# Patient Record
Sex: Female | Born: 1960 | Race: Black or African American | Hispanic: No | Marital: Married
Health system: Southern US, Community
[De-identification: ages and names within clinical notes are randomized; demographics above are authoritative.]

---

## 2002-12-03 DIAGNOSIS — I10 Essential (primary) hypertension: Secondary | ICD-10-CM | POA: Insufficient documentation

## 2004-07-14 ENCOUNTER — Encounter: Admission: RE | Admit: 2004-07-14 | Discharge: 2004-07-14 | Payer: Self-pay | Admitting: Family Medicine

## 2004-07-20 ENCOUNTER — Encounter: Admission: RE | Admit: 2004-07-20 | Discharge: 2004-07-20 | Payer: Self-pay | Admitting: Family Medicine

## 2004-11-06 ENCOUNTER — Ambulatory Visit (HOSPITAL_BASED_OUTPATIENT_CLINIC_OR_DEPARTMENT_OTHER): Admission: RE | Admit: 2004-11-06 | Discharge: 2004-11-06 | Payer: Self-pay | Admitting: Specialist

## 2004-11-06 ENCOUNTER — Ambulatory Visit (HOSPITAL_COMMUNITY): Admission: RE | Admit: 2004-11-06 | Discharge: 2004-11-06 | Payer: Self-pay | Admitting: Specialist

## 2013-09-21 ENCOUNTER — Other Ambulatory Visit: Payer: Self-pay

## 2013-09-21 DIAGNOSIS — Z9889 Other specified postprocedural states: Secondary | ICD-10-CM

## 2013-09-21 DIAGNOSIS — Z1231 Encounter for screening mammogram for malignant neoplasm of breast: Secondary | ICD-10-CM

## 2013-10-06 ENCOUNTER — Ambulatory Visit
Admission: RE | Admit: 2013-10-06 | Discharge: 2013-10-06 | Disposition: A | Discharge status: Home / Self Care | Payer: Self-pay | Source: Ambulatory Visit

## 2013-10-06 DIAGNOSIS — Z1231 Encounter for screening mammogram for malignant neoplasm of breast: Secondary | ICD-10-CM

## 2013-10-06 DIAGNOSIS — Z9889 Other specified postprocedural states: Secondary | ICD-10-CM

## 2013-10-09 ENCOUNTER — Other Ambulatory Visit: Payer: Self-pay | Admitting: Obstetrics and Gynecology

## 2013-10-09 DIAGNOSIS — R928 Other abnormal and inconclusive findings on diagnostic imaging of breast: Secondary | ICD-10-CM

## 2013-10-16 ENCOUNTER — Ambulatory Visit
Admission: RE | Admit: 2013-10-16 | Discharge: 2013-10-16 | Disposition: A | Discharge status: Home / Self Care | Payer: BC Managed Care – PPO | Source: Ambulatory Visit | Attending: Obstetrics and Gynecology | Admitting: Obstetrics and Gynecology

## 2013-10-16 DIAGNOSIS — R928 Other abnormal and inconclusive findings on diagnostic imaging of breast: Secondary | ICD-10-CM

## 2013-10-28 ENCOUNTER — Other Ambulatory Visit: Payer: BC Managed Care – PPO

## 2016-09-03 DIAGNOSIS — M5430 Sciatica, unspecified side: Secondary | ICD-10-CM | POA: Diagnosis not present

## 2016-09-19 DIAGNOSIS — Z0000 Encounter for general adult medical examination without abnormal findings: Secondary | ICD-10-CM | POA: Diagnosis not present

## 2016-09-19 DIAGNOSIS — H9311 Tinnitus, right ear: Secondary | ICD-10-CM | POA: Diagnosis not present

## 2016-09-19 DIAGNOSIS — I10 Essential (primary) hypertension: Secondary | ICD-10-CM | POA: Diagnosis not present

## 2016-09-19 DIAGNOSIS — Z23 Encounter for immunization: Secondary | ICD-10-CM | POA: Diagnosis not present

## 2017-03-25 DIAGNOSIS — H40053 Ocular hypertension, bilateral: Secondary | ICD-10-CM | POA: Diagnosis not present

## 2017-03-25 DIAGNOSIS — H40023 Open angle with borderline findings, high risk, bilateral: Secondary | ICD-10-CM | POA: Diagnosis not present

## 2017-11-01 DIAGNOSIS — H9319 Tinnitus, unspecified ear: Secondary | ICD-10-CM | POA: Diagnosis not present

## 2017-11-01 DIAGNOSIS — E7800 Pure hypercholesterolemia, unspecified: Secondary | ICD-10-CM | POA: Diagnosis not present

## 2017-11-01 DIAGNOSIS — I10 Essential (primary) hypertension: Secondary | ICD-10-CM | POA: Diagnosis not present

## 2017-11-01 DIAGNOSIS — K219 Gastro-esophageal reflux disease without esophagitis: Secondary | ICD-10-CM | POA: Diagnosis not present

## 2017-11-01 DIAGNOSIS — Z0000 Encounter for general adult medical examination without abnormal findings: Secondary | ICD-10-CM | POA: Diagnosis not present

## 2017-11-06 DIAGNOSIS — Z130 Encounter for screening for diseases of the blood and blood-forming organs and certain disorders involving the immune mechanism: Secondary | ICD-10-CM | POA: Diagnosis not present

## 2017-11-06 DIAGNOSIS — Z1231 Encounter for screening mammogram for malignant neoplasm of breast: Secondary | ICD-10-CM | POA: Diagnosis not present

## 2017-11-06 DIAGNOSIS — Z124 Encounter for screening for malignant neoplasm of cervix: Secondary | ICD-10-CM | POA: Diagnosis not present

## 2017-11-06 DIAGNOSIS — Z1389 Encounter for screening for other disorder: Secondary | ICD-10-CM | POA: Diagnosis not present

## 2017-11-06 DIAGNOSIS — Z01419 Encounter for gynecological examination (general) (routine) without abnormal findings: Secondary | ICD-10-CM | POA: Diagnosis not present

## 2017-11-06 DIAGNOSIS — Z1151 Encounter for screening for human papillomavirus (HPV): Secondary | ICD-10-CM | POA: Diagnosis not present

## 2017-11-06 DIAGNOSIS — N951 Menopausal and female climacteric states: Secondary | ICD-10-CM | POA: Diagnosis not present

## 2017-11-06 DIAGNOSIS — Z6841 Body Mass Index (BMI) 40.0 and over, adult: Secondary | ICD-10-CM | POA: Diagnosis not present

## 2017-11-28 DIAGNOSIS — H9313 Tinnitus, bilateral: Secondary | ICD-10-CM | POA: Diagnosis not present

## 2017-11-28 DIAGNOSIS — H9311 Tinnitus, right ear: Secondary | ICD-10-CM | POA: Diagnosis not present

## 2017-11-28 DIAGNOSIS — H903 Sensorineural hearing loss, bilateral: Secondary | ICD-10-CM | POA: Diagnosis not present

## 2017-11-28 DIAGNOSIS — H6123 Impacted cerumen, bilateral: Secondary | ICD-10-CM | POA: Diagnosis not present

## 2018-01-06 DIAGNOSIS — Z01419 Encounter for gynecological examination (general) (routine) without abnormal findings: Secondary | ICD-10-CM | POA: Diagnosis not present

## 2018-05-14 DIAGNOSIS — K219 Gastro-esophageal reflux disease without esophagitis: Secondary | ICD-10-CM | POA: Diagnosis not present

## 2018-05-14 DIAGNOSIS — I10 Essential (primary) hypertension: Secondary | ICD-10-CM | POA: Diagnosis not present

## 2018-05-14 DIAGNOSIS — M7630 Iliotibial band syndrome, unspecified leg: Secondary | ICD-10-CM | POA: Diagnosis not present

## 2018-11-07 DIAGNOSIS — Z1231 Encounter for screening mammogram for malignant neoplasm of breast: Secondary | ICD-10-CM | POA: Diagnosis not present

## 2018-11-07 DIAGNOSIS — Z6841 Body Mass Index (BMI) 40.0 and over, adult: Secondary | ICD-10-CM | POA: Diagnosis not present

## 2018-11-07 DIAGNOSIS — Z01419 Encounter for gynecological examination (general) (routine) without abnormal findings: Secondary | ICD-10-CM | POA: Diagnosis not present

## 2018-11-07 DIAGNOSIS — Z130 Encounter for screening for diseases of the blood and blood-forming organs and certain disorders involving the immune mechanism: Secondary | ICD-10-CM | POA: Diagnosis not present

## 2018-11-07 DIAGNOSIS — Z1389 Encounter for screening for other disorder: Secondary | ICD-10-CM | POA: Diagnosis not present

## 2018-11-10 DIAGNOSIS — Z01419 Encounter for gynecological examination (general) (routine) without abnormal findings: Secondary | ICD-10-CM | POA: Diagnosis not present

## 2018-12-26 DIAGNOSIS — H6123 Impacted cerumen, bilateral: Secondary | ICD-10-CM | POA: Diagnosis not present

## 2018-12-26 DIAGNOSIS — K219 Gastro-esophageal reflux disease without esophagitis: Secondary | ICD-10-CM | POA: Diagnosis not present

## 2018-12-26 DIAGNOSIS — E7800 Pure hypercholesterolemia, unspecified: Secondary | ICD-10-CM | POA: Diagnosis not present

## 2018-12-26 DIAGNOSIS — I10 Essential (primary) hypertension: Secondary | ICD-10-CM | POA: Diagnosis not present

## 2019-10-19 DIAGNOSIS — Z03818 Encounter for observation for suspected exposure to other biological agents ruled out: Secondary | ICD-10-CM | POA: Diagnosis not present

## 2019-10-20 DIAGNOSIS — Z20828 Contact with and (suspected) exposure to other viral communicable diseases: Secondary | ICD-10-CM | POA: Diagnosis not present

## 2019-11-25 DIAGNOSIS — Z1231 Encounter for screening mammogram for malignant neoplasm of breast: Secondary | ICD-10-CM | POA: Diagnosis not present

## 2019-11-25 DIAGNOSIS — Z130 Encounter for screening for diseases of the blood and blood-forming organs and certain disorders involving the immune mechanism: Secondary | ICD-10-CM | POA: Diagnosis not present

## 2019-11-25 DIAGNOSIS — Z1389 Encounter for screening for other disorder: Secondary | ICD-10-CM | POA: Diagnosis not present

## 2019-11-25 DIAGNOSIS — Z01419 Encounter for gynecological examination (general) (routine) without abnormal findings: Secondary | ICD-10-CM | POA: Diagnosis not present

## 2019-12-30 DIAGNOSIS — E7800 Pure hypercholesterolemia, unspecified: Secondary | ICD-10-CM | POA: Diagnosis not present

## 2019-12-30 DIAGNOSIS — I10 Essential (primary) hypertension: Secondary | ICD-10-CM | POA: Diagnosis not present

## 2019-12-30 DIAGNOSIS — M62830 Muscle spasm of back: Secondary | ICD-10-CM | POA: Diagnosis not present

## 2020-01-28 DIAGNOSIS — Z01419 Encounter for gynecological examination (general) (routine) without abnormal findings: Secondary | ICD-10-CM | POA: Diagnosis not present

## 2020-08-25 DIAGNOSIS — M62830 Muscle spasm of back: Secondary | ICD-10-CM | POA: Diagnosis not present

## 2020-08-25 DIAGNOSIS — G43019 Migraine without aura, intractable, without status migrainosus: Secondary | ICD-10-CM | POA: Diagnosis not present

## 2020-10-03 DIAGNOSIS — H40023 Open angle with borderline findings, high risk, bilateral: Secondary | ICD-10-CM | POA: Diagnosis not present

## 2020-10-31 DIAGNOSIS — H40013 Open angle with borderline findings, low risk, bilateral: Secondary | ICD-10-CM | POA: Diagnosis not present

## 2022-02-26 ENCOUNTER — Other Ambulatory Visit: Payer: Self-pay | Admitting: Family Medicine

## 2022-02-26 DIAGNOSIS — S82046B Nondisplaced comminuted fracture of unspecified patella, initial encounter for open fracture type I or II: Secondary | ICD-10-CM

## 2022-02-26 DIAGNOSIS — M2390 Unspecified internal derangement of unspecified knee: Secondary | ICD-10-CM

## 2022-05-22 ENCOUNTER — Other Ambulatory Visit: Payer: Self-pay | Admitting: Orthopedic Surgery

## 2022-05-22 DIAGNOSIS — M542 Cervicalgia: Secondary | ICD-10-CM

## 2022-05-24 ENCOUNTER — Ambulatory Visit
Admission: RE | Admit: 2022-05-24 | Discharge: 2022-05-24 | Disposition: A | Discharge status: Home / Self Care | Payer: 59 | Source: Ambulatory Visit | Attending: Orthopedic Surgery | Admitting: Orthopedic Surgery

## 2022-05-24 DIAGNOSIS — M542 Cervicalgia: Secondary | ICD-10-CM

## 2022-06-21 ENCOUNTER — Ambulatory Visit
Admission: RE | Admit: 2022-06-21 | Discharge: 2022-06-21 | Disposition: A | Discharge status: Home / Self Care | Payer: 59 | Source: Ambulatory Visit | Attending: Family Medicine | Admitting: Family Medicine

## 2022-06-21 DIAGNOSIS — S82046B Nondisplaced comminuted fracture of unspecified patella, initial encounter for open fracture type I or II: Secondary | ICD-10-CM

## 2022-06-21 DIAGNOSIS — M2390 Unspecified internal derangement of unspecified knee: Secondary | ICD-10-CM

## 2022-11-13 DIAGNOSIS — R61 Generalized hyperhidrosis: Secondary | ICD-10-CM | POA: Insufficient documentation

## 2022-12-10 LAB — COLOGUARD: COLOGUARD: NEGATIVE

## 2022-12-25 IMAGING — MR MR CERVICAL SPINE W/O CM
4 of 5 series · 27 of 48 positions shown · non-contrast
Comparison: None Available.

CLINICAL DATA: Neck pain with right arm pain

EXAM:
MRI CERVICAL SPINE WITHOUT CONTRAST
TECHNIQUE: Multiplanar, multisequence MR imaging of the cervical spine was
performed. No intravenous contrast was administered.

[Series 5: T2 · sagittal · 3.0mm · 0.55mm/px · 6 of 15 slices shown (1 of 2)]
[im 1/15]
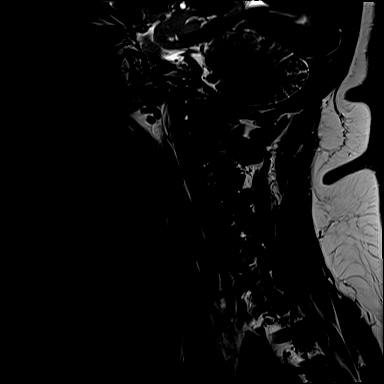
[im 3/15]
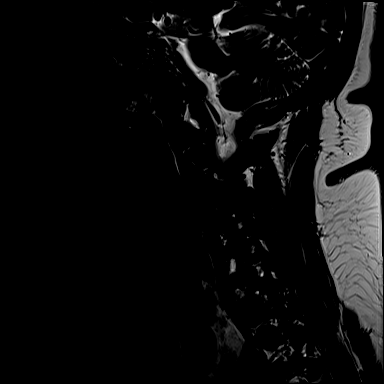
[im 6/15]
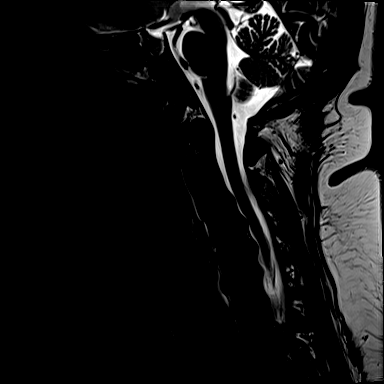
[im 9/15]
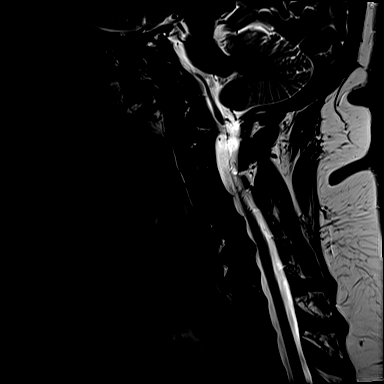
[im 12/15]
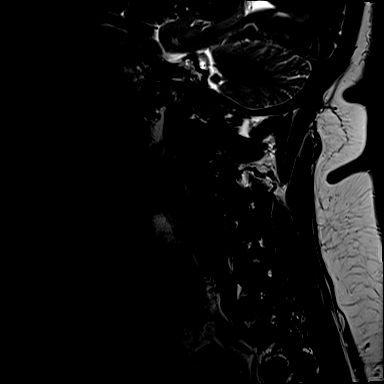
[im 15/15]
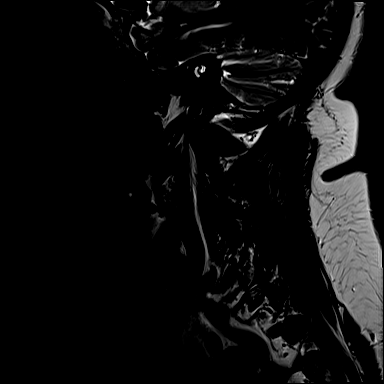

[Series 6: T1 · sagittal · 3.0mm · 0.66mm/px · 7 of 15 slices shown]
[im 1/15]
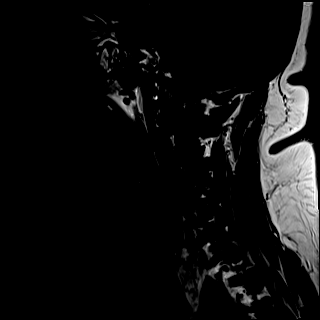
[im 3/15]
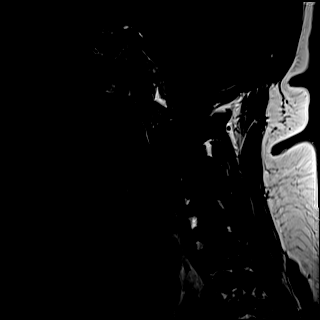
[im 5/15]
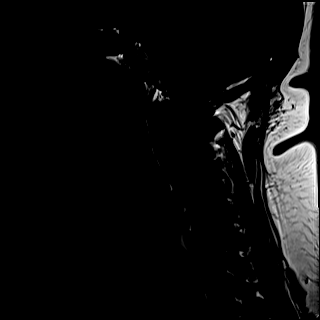
[im 8/15]
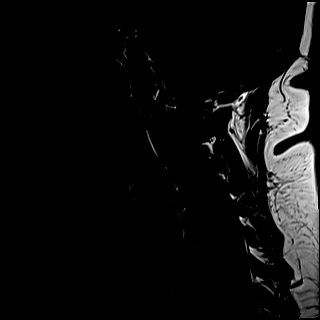
[im 10/15]
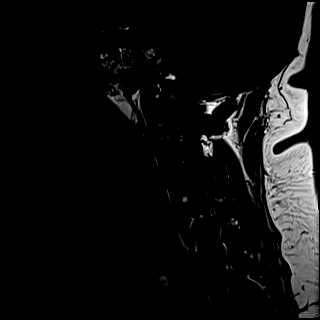
[im 12/15]
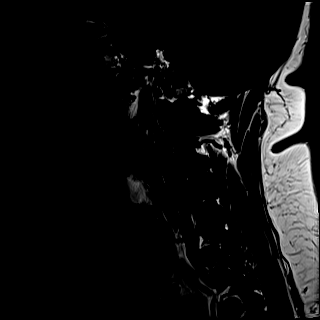
[im 15/15]
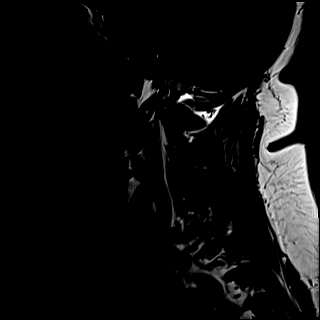

[Series 7: STIR · sagittal · 3.0mm · 0.33mm/px · 6 of 15 slices shown]
[im 1/15]
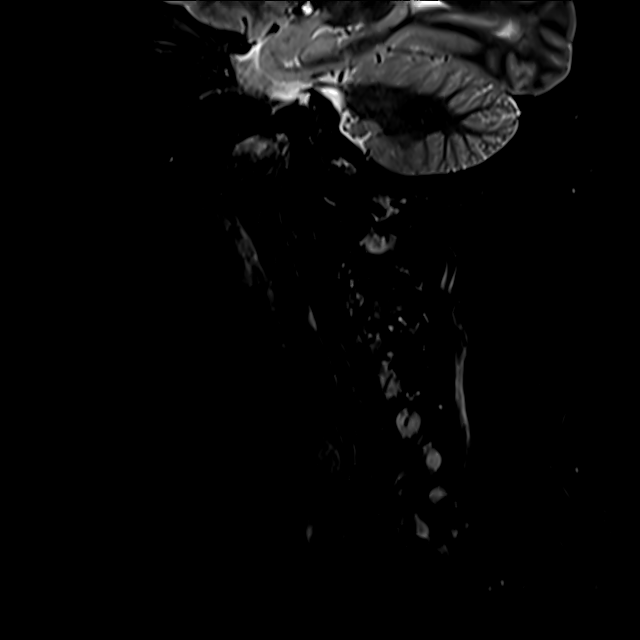
[im 3/15]
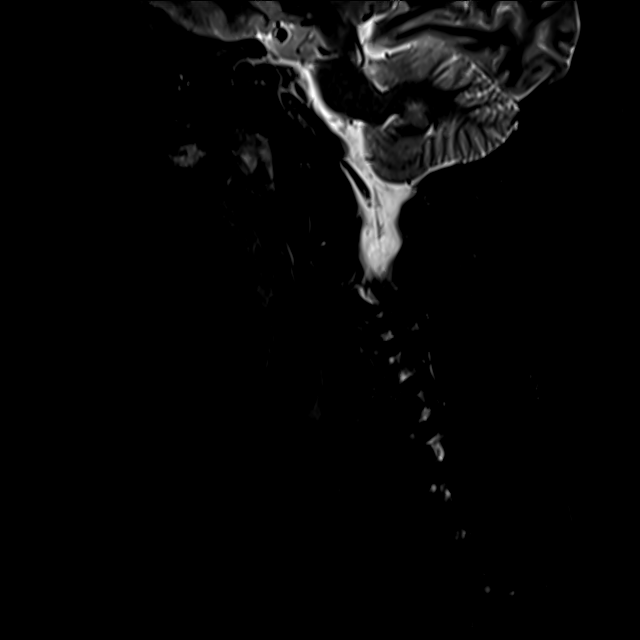
[im 5/15]
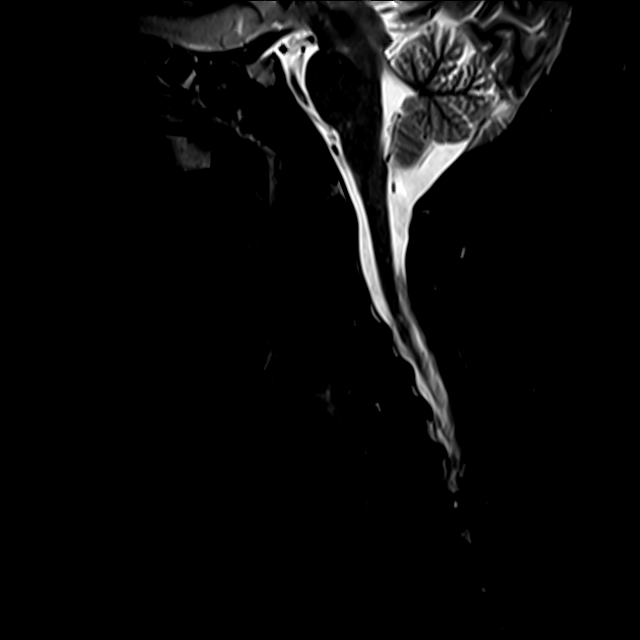
[im 8/15]
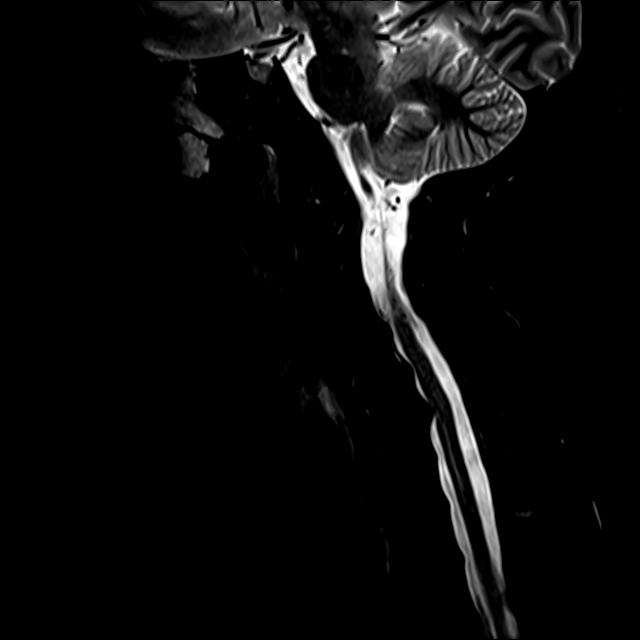
[im 10/15]
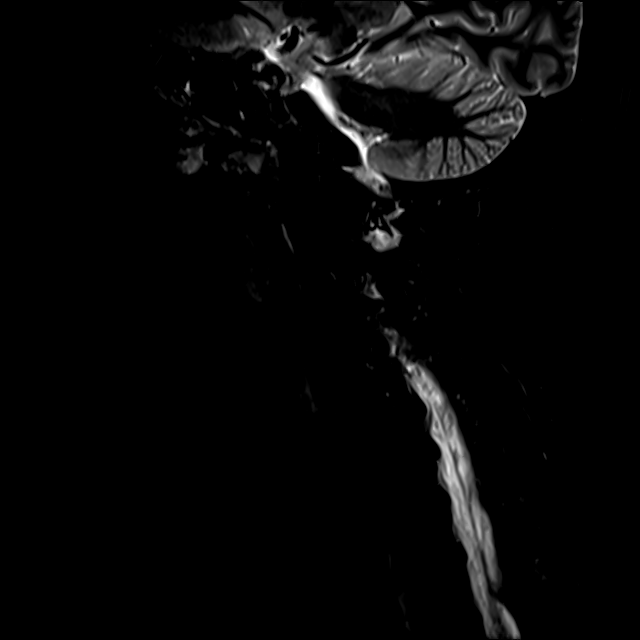
[im 12/15]
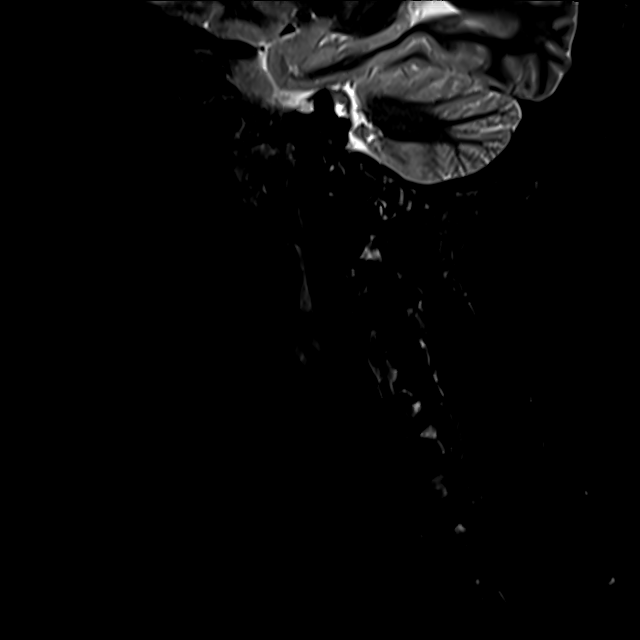

[Series 8: T2 · axial · 3.0mm · 0.50mm/px · z∈[-66,+26]mm · 8 of 30 slices shown (2 of 2)]
[im 1/30]
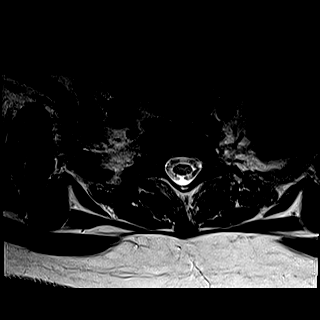
[im 5/30]
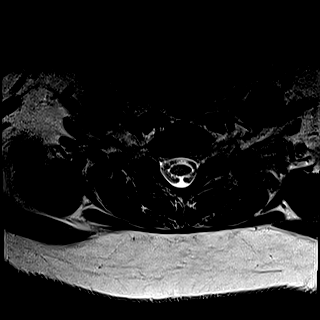
[im 9/30]
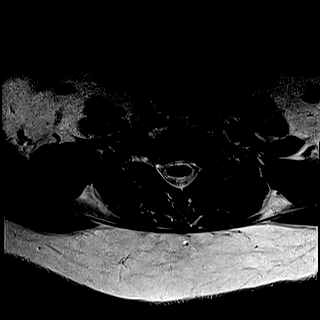
[im 14/30]
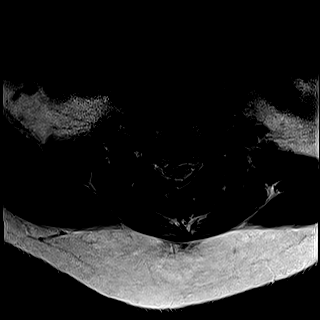
[im 16/30]
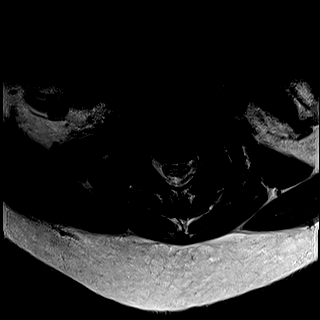
[im 21/30]
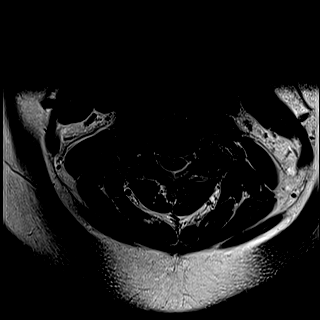
[im 25/30]
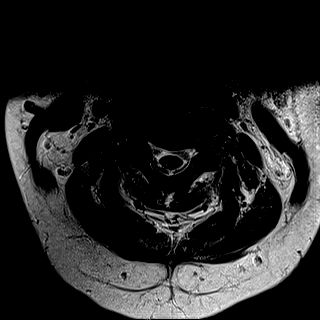
[im 30/30]
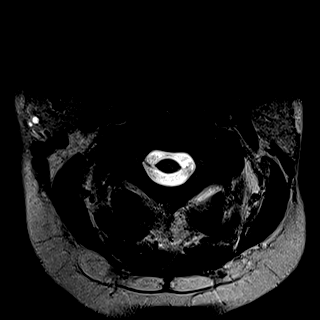

[27 of 48 positions shown; findings below may reference images not displayed]

FINDINGS: Alignment: Normal alignment.  Mild cervical kyphosis.

Vertebrae: Negative for fracture or mass

Cord: Normal signal and morphology

Posterior Fossa, vertebral arteries, paraspinal tissues: Negative

Disc levels:

C2-3: Facet degeneration on the right. Mild right foraminal
narrowing

C3-4: Mild disc and facet degeneration bilaterally. Mild foraminal
narrowing bilaterally due to spurring

C4-5: Disc degeneration with mild diffuse uncinate spurring. No
significant stenosis

C5-6: Central disc protrusion with associated spurring. Cord
flattening. Adequate CSF around the cord. Mild foraminal narrowing
bilaterally due to spurring

C6-7: Mild disc degeneration and spurring. Mild foraminal narrowing
bilaterally

C7-T1: Mild foraminal narrowing bilaterally due to spurring.
IMPRESSION: Multilevel cervical spondylosis.  Negative for fracture

Mild foraminal narrowing bilaterally multiple levels due to
spurring. Central disc protrusion C5-6 with cord flattening.

## 2023-07-08 ENCOUNTER — Ambulatory Visit (INDEPENDENT_AMBULATORY_CARE_PROVIDER_SITE_OTHER): Payer: 59

## 2023-07-08 ENCOUNTER — Other Ambulatory Visit: Payer: Self-pay | Admitting: Podiatry

## 2023-07-08 ENCOUNTER — Ambulatory Visit (INDEPENDENT_AMBULATORY_CARE_PROVIDER_SITE_OTHER): Payer: 59 | Admitting: Podiatry

## 2023-07-08 DIAGNOSIS — M2012 Hallux valgus (acquired), left foot: Secondary | ICD-10-CM

## 2023-07-08 DIAGNOSIS — N951 Menopausal and female climacteric states: Secondary | ICD-10-CM | POA: Insufficient documentation

## 2023-07-08 DIAGNOSIS — M21619 Bunion of unspecified foot: Secondary | ICD-10-CM

## 2023-07-08 DIAGNOSIS — E669 Obesity, unspecified: Secondary | ICD-10-CM | POA: Insufficient documentation

## 2023-07-08 NOTE — Progress Notes (Signed)
Chief Complaint  Patient presents with   Bunions    Left foot bunion pain     Subjective: 62 y.o. female presents today as a new patient for evaluation of pain and tenderness associated to a bunion of the left foot.  Patient has history of bunionectomy surgery to the right foot several years prior by the late Dr. Ralene Cork who was once with our practice.  Good results. Patient states that over the last several years she has had increased pain and tenderness associated to the bunion of the left foot.  It is affecting her daily quality of life and she is unable to wear closed toed shoes without irritation or rubbing.  She has tried different shoe alternatives with no improvement.  She has pain on a daily basis.  No past medical history on file.  Not on File   Objective: Physical Exam General: The patient is alert and oriented x3 in no acute distress.  Dermatology: Skin is cool, dry and supple bilateral lower extremities. Negative for open lesions or macerations.  Vascular: Palpable pedal pulses bilaterally. No edema or erythema noted. Capillary refill within normal limits.  Neurological: Grossly intact via light touch  Musculoskeletal Exam: Clinical evidence of bunion deformity noted to the respective foot. There is moderate pain on palpation range of motion of the first MPJ. Lateral deviation of the hallux noted consistent with hallux abductovalgus.  Radiographic Exam LT foot 07/08/2023: Normal osseous mineralization.  No acute fractures identified.  Increased intermetatarsal angle greater than 15 with a hallux abductus angle greater than 30 noted on AP view.  There is also a component of metatarsus adductus with medial deviation of the lesser metatarsals in addition to the hallux valgus deformity  Assessment: 1.  Hallux valgus left 2.  Metatarsus adductus left  3.  History of bunionectomy right   Plan of Care:  -Patient was evaluated. X-Rays reviewed. -Unfortunately the patient  has failed conservative treatment for the bunion deformity.  It is now painful on a daily basis and affecting her daily quality of life despite conservative care including shoe gear modifications and arch supports.  I do believe it is appropriate to discuss surgery at this time -Initially we had discussed addressing the metatarsus adductus to the left foot in addition to the hallux valgus deformity, however the patient has had good success with simple distal type bunionectomy to the right foot and she is not symptomatic or painful to the lesser TMT's of the foot.  We will proceed with a more simple distal bunionectomy to the left foot since she had such good success to the right foot. -Risk benefits advantages and disadvantages as well as the postoperative recovery course were explained in detail to the patient.  No guarantees were expressed or implied.  She understands that she will be minimal weightbearing in the cam boot for about 6 weeks postoperatively.  All patient questions were answered. -Authorization for surgery was initiated today.  Surgery will consist of bunionectomy with osteotomy left -Patient will need medical clearance from her PCP and cardiologist -Return to clinic 1 week postop   Felecia Shelling, DPM [facility]  Dr. Felecia Shelling, DPM    [address], [place] [address]  Office [phone]  Fax [phone]

## 2023-10-15 ENCOUNTER — Telehealth: Payer: Self-pay | Admitting: Urology

## 2023-10-15 NOTE — Telephone Encounter (Signed)
Pt called stating she wanted to cxl her sx with Dr. Logan Bores on 12/12/23 due insurance reasons. She said she will call back to reschedule. I have notified Aram Beecham with [facility] and Dr. Logan Bores of this change.

## 2023-12-18 ENCOUNTER — Encounter: Payer: 59 | Admitting: Podiatry

## 2023-12-25 ENCOUNTER — Encounter: Payer: 59 | Admitting: Podiatry

## 2024-01-08 ENCOUNTER — Encounter: Payer: 59 | Admitting: Podiatry

## 2024-12-31 ENCOUNTER — Other Ambulatory Visit (HOSPITAL_COMMUNITY): Payer: Self-pay | Admitting: Family Medicine

## 2024-12-31 DIAGNOSIS — E7800 Pure hypercholesterolemia, unspecified: Secondary | ICD-10-CM

## 2025-01-13 ENCOUNTER — Other Ambulatory Visit (HOSPITAL_BASED_OUTPATIENT_CLINIC_OR_DEPARTMENT_OTHER)
